# Patient Record
Sex: Male | Born: 1985 | Race: Black or African American | Hispanic: No | Marital: Single | Smoking: Never smoker
Health system: Southern US, Community
[De-identification: ages and names within clinical notes are randomized; demographics above are authoritative.]

---

## 2007-03-31 ENCOUNTER — Emergency Department (HOSPITAL_COMMUNITY): Admission: EM | Admit: 2007-03-31 | Discharge: 2007-03-31 | Payer: Self-pay | Admitting: Emergency Medicine

## 2009-02-09 ENCOUNTER — Emergency Department (HOSPITAL_COMMUNITY): Admission: EM | Admit: 2009-02-09 | Discharge: 2009-02-09 | Payer: Self-pay | Admitting: Emergency Medicine

## 2011-04-15 ENCOUNTER — Encounter (HOSPITAL_COMMUNITY): Payer: Self-pay

## 2011-04-15 ENCOUNTER — Emergency Department (INDEPENDENT_AMBULATORY_CARE_PROVIDER_SITE_OTHER)
Admission: EM | Admit: 2011-04-15 | Discharge: 2011-04-15 | Disposition: A | Discharge status: Home / Self Care | Payer: PRIVATE HEALTH INSURANCE | Source: Home / Self Care | Attending: Emergency Medicine | Admitting: Emergency Medicine

## 2011-04-15 DIAGNOSIS — Z2089 Contact with and (suspected) exposure to other communicable diseases: Secondary | ICD-10-CM

## 2011-04-15 DIAGNOSIS — Z202 Contact with and (suspected) exposure to infections with a predominantly sexual mode of transmission: Secondary | ICD-10-CM

## 2011-04-15 MED ORDER — METRONIDAZOLE 500 MG PO TABS: ORAL_TABLET | ORAL | Status: AC

## 2011-04-15 NOTE — ED Notes (Signed)
Notified by sexual partner that she tested positive to trichomonas.

## 2011-04-15 NOTE — Discharge Instructions (Signed)
You have been diagnosed with a possible STD.  Your results should be back in 3 days.  You can call us here at [phone] and ask for Suzanne.  She can tell you whether or not your results are back, but you must come here to get your results.  We do this to protect our patients' confidentiality.  You can come Monday through Friday and tell the receptionist that your are just here to get test results.  In the meantime, you should avoid intercourse altogether for 1 week.  After that, you should always use condoms--100% of the time.  This will not only prevent pregnancy, but has been shown to prevent HIV, syphilis, gonorrhea, chlamydia, hepatis C and other STDs.  If your test comes back positive, we are required by law to report it to the Health Department.  We also suggest you inform your partner or partners so they can get tested and treated as well.  

## 2011-04-15 NOTE — ED Provider Notes (Signed)
Chief Complaint  Patient presents with  . Exposure to STD    History of Present Illness:   The patient is a 26 year old male who was informed by his girlfriend that she had trichomonas. Her last sexual contact was a month ago. He's not had intercourse with anyone else since then. He denies any symptoms. He has had no discharge, dysuria, penile pain, lesions of the penis, adenopathy, or pain or swelling of the testes. He denies fever, chills, sweats, skin rash, adenopathy, or joint pain.  Review of Systems:  Other than noted above, the patient denies any of the following symptoms: Systemic:  No fevers chills, aches, weight loss, arthralgias, myalgias, or adenopathy. GI:  No abdominal pain, nausea or vomiting. GU:  No dysuria, penile pain, discharge, itching, dysuria, genital lesions, testicular pain or swelling. Skin:  No rash or itching.  PMFSH:  Past medical history, family history, social history, meds, and allergies were reviewed.  Physical Exam:   Vital signs:  BP 113/60  Pulse 55  Temp(Src) 98.1 F (36.7 C) (Oral)  Resp 16  SpO2 100% Gen:  Alert, oriented, in no distress. Abdomen:  Soft and flat, non-distended, and non-tender.  No organomegaly or mass. Genital:  He is uncircumcised. There was no urethral discharge. No lesions on the penis or the genital area. Testes are normal. No inguinal adenopathy. Skin:  Warm and dry.  No rash.   Other Labs Obtained at Urgent Care Center:  GC and Chlamydia DNA probe were obtained as well as serology for HIV and syphilis.  Results are pending at this time and we will call about any positive results.  Assessment:   Diagnoses that have been ruled out:  None  Diagnoses that are still under consideration:  None  Final diagnoses:  Exposure to STD    Plan:   1.  The following meds were prescribed:   New Prescriptions   METRONIDAZOLE (FLAGYL) 500 MG TABLET    2 tabs twice daily   2.  The patient was instructed in symptomatic care and  handouts were given. 3.  The patient was told to return if becoming worse in any way, if no better in 3 or 4 days, and given some red flag symptoms that would indicate earlier return. 4.  The patient was instructed to inform all sexual contacts, avoid intercourse completely for 2 weeks and then only with a condom.  The patient was told that we would call about all abnormal lab results, and that we would need to report certain kinds of infection to the health department.    Reuben Likes, MD 04/15/11 [phone]

## 2011-04-16 LAB — RPR: RPR Ser Ql: NONREACTIVE

## 2011-04-16 LAB — HIV ANTIBODY (ROUTINE TESTING W REFLEX): HIV: NONREACTIVE

## 2011-04-17 LAB — GC/CHLAMYDIA PROBE AMP, GENITAL
Chlamydia, DNA Probe: NEGATIVE
GC Probe Amp, Genital: NEGATIVE

## 2011-05-15 ENCOUNTER — Telehealth (HOSPITAL_COMMUNITY): Payer: Self-pay | Admitting: *Deleted

## 2011-05-15 NOTE — ED Notes (Signed)
Pt. called for his lab results. GC/Chlamydia neg. HIV and RPR non-reactive. Pt. verified x 2 and given results except HIV.  Pt. told he can come in with a picture ID to view that result. He said I had told him, I would have contacted him before now if anything had been positive. I told him I did say that.  Vassie Moselle 05/15/2011

## 2011-09-10 ENCOUNTER — Encounter (HOSPITAL_COMMUNITY): Payer: Self-pay | Admitting: Emergency Medicine

## 2011-09-10 ENCOUNTER — Emergency Department (HOSPITAL_COMMUNITY)
Admission: EM | Admit: 2011-09-10 | Discharge: 2011-09-10 | Disposition: A | Discharge status: Home / Self Care | Payer: Managed Care, Other (non HMO) | Source: Home / Self Care | Attending: Emergency Medicine | Admitting: Emergency Medicine

## 2011-09-10 ENCOUNTER — Emergency Department (HOSPITAL_COMMUNITY)
Admission: EM | Admit: 2011-09-10 | Discharge: 2011-09-10 | Disposition: A | Discharge status: Home / Self Care | Payer: Self-pay | Source: Home / Self Care

## 2011-09-10 DIAGNOSIS — N481 Balanitis: Secondary | ICD-10-CM

## 2011-09-10 DIAGNOSIS — N476 Balanoposthitis: Secondary | ICD-10-CM

## 2011-09-10 DIAGNOSIS — B3749 Other urogenital candidiasis: Secondary | ICD-10-CM

## 2011-09-10 MED ORDER — NYSTATIN 100000 UNIT/GM EX CREA: TOPICAL_CREAM | Freq: Two times a day (BID) | CUTANEOUS | Status: AC

## 2011-09-10 NOTE — ED Notes (Signed)
Rash penis onset x few days

## 2011-09-10 NOTE — ED Provider Notes (Signed)
Chief Complaint  Patient presents with  . Rash    History of Present Illness:   The patient is a 26 year old male who comes in today for STD testing. His girlfriend was diagnosed as having a yeast infection. He noticed some red bumps on the glans penis this week there were somewhat itchy. He is uncircumcised. He denies any blisters or ulcerations. He has had a slight discharge but no dysuria. No fever, chills, adenopathy, or skin rash. He does have a history of Trichomonas in the past.  Review of Systems:  Other than noted above, the patient denies any of the following symptoms: Systemic:  No fevers chills, aches, weight loss, arthralgias, myalgias, or adenopathy. GI:  No abdominal pain, nausea or vomiting. GU:  No dysuria, penile pain, discharge, itching, dysuria, genital lesions, testicular pain or swelling. Skin:  No rash or itching.  PMFSH:  Past medical history, family history, social history, meds, and allergies were reviewed.  Physical Exam:   Vital signs:  BP 132/77  Pulse 61  Temp 98.3 F (36.8 C) (Oral)  Resp 18  SpO2 97% Gen:  Alert, oriented, in no distress. Abdomen:  Soft and flat, non-distended, and non-tender.  No organomegaly or mass. Genital:  He is uncircumcised. The penis itself is normal with no ulcerations or lesions. No inguinal lymphadenopathy and testes were normal. Retracting the foreskin reveals the glands to be normal. There is no inflammation or swelling. No urethral discharge. Skin:  Warm and dry.  No rash.   Other Labs Obtained at Urgent Care Center:  Urethral swab was obtained for GC and Chlamydia DNA probe. Also obtained blood work for HIV, syphilis, and herpes.  Results are pending at this time and we will call about any positive results.   Assessment:  The encounter diagnosis was Balanitis.  I think this is just a Candida balanitis. I don't see any sign of any STDs. His foreskin is fairly tight. He was encouraged to retracting the foreskin released  twice a day and cleanse the foreskin and glans. A call back at any of the tests come back positive.  Plan:   1.  The following meds were prescribed:   New Prescriptions   NYSTATIN CREAM (MYCOSTATIN)    Apply topically 2 (two) times daily.   2.  The patient was instructed in symptomatic care and handouts were given. 3.  The patient was told to return if becoming worse in any way, if no better in 3 or 4 days, and given some red flag symptoms that would indicate earlier return. 4.  The patient was instructed to inform all sexual contacts, avoid intercourse completely for 2 weeks and then only with a condom.  The patient was told that we would call about all abnormal lab results, and that we would need to report certain kinds of infection to the health department.    Reuben Likes, MD 09/10/11 [phone]

## 2011-09-11 LAB — HSV 2 ANTIBODY, IGG: HSV 2 Glycoprotein G Ab, IgG: 0.72 IV

## 2012-04-16 ENCOUNTER — Encounter (HOSPITAL_COMMUNITY): Payer: Self-pay | Admitting: Emergency Medicine

## 2012-04-16 ENCOUNTER — Emergency Department (INDEPENDENT_AMBULATORY_CARE_PROVIDER_SITE_OTHER): Payer: Managed Care, Other (non HMO)

## 2012-04-16 ENCOUNTER — Emergency Department (HOSPITAL_COMMUNITY)
Admission: EM | Admit: 2012-04-16 | Discharge: 2012-04-16 | Disposition: A | Discharge status: Home / Self Care | Payer: Managed Care, Other (non HMO) | Source: Home / Self Care

## 2012-04-16 DIAGNOSIS — S62309A Unspecified fracture of unspecified metacarpal bone, initial encounter for closed fracture: Secondary | ICD-10-CM

## 2012-04-16 MED ORDER — HYDROCODONE-ACETAMINOPHEN 5-325 MG PO TABS: 1.0000 | ORAL_TABLET | Freq: Three times a day (TID) | ORAL | Status: AC | PRN

## 2012-04-16 NOTE — ED Provider Notes (Signed)
History     CSN: [id number]  Arrival date & time 04/16/12  4098   First MD Initiated Contact with Patient 04/16/12 1840      Chief Complaint  Patient presents with  . Hand Injury    injury to right hand after hitting a frig. incident happended about 30 min. ago.      Patient is a 27 y.o. male presenting with hand injury.  Hand Injury - patient is a 27 year old male who today was choking around with friends and punched a refrigerator. Patient had significant pain over the ulnar aspect of his right hand immediately afterwards. Patient had significant amount of swelling as well. Patient denies any history of injury in this hand before. Patient denies any numbness or tingling and states that he is still able to move the hand at this time. Patient is not taking any medications at this time.  History reviewed. No pertinent past medical history.  History reviewed. No pertinent past surgical history.  History reviewed. No pertinent family history.  History  Substance Use Topics  . Smoking status: Never Smoker   . Smokeless tobacco: Not on file  . Alcohol Use: Yes     Comment: occasional      Review of Systems 14 system review was done and unremarkable as related to the chief complaint. Allergies  Review of patient's allergies indicates no known allergies.  Home Medications   Current Outpatient Rx  Name  Route  Sig  Dispense  Refill  . HYDROcodone-acetaminophen (NORCO) 5-325 MG per tablet   Oral   Take 1 tablet by mouth every 8 (eight) hours as needed for pain.   20 tablet   0   . metroNIDAZOLE (FLAGYL) 500 MG tablet      2 tabs twice daily   4 tablet   0   . nystatin cream (MYCOSTATIN)   Topical   Apply topically 2 (two) times daily.   30 g   3     BP 127/63  Pulse 60  Temp(Src) 98.4 F (36.9 C) (Oral)  Resp 16  SpO2 100%  Physical Exam General: No apparent distress alert and oriented x3 mood and affect normal Respiratory: Patient's speak in full  sentences and does not appear short of breath Skin: Warm dry intact with no signs of infection or rash Neuro: Cranial nerves II through XII are intact, neurovascularly intact in all extremities with 2+ DTRs and 2+ pulses. Right hand exam: On inspection patient does have an effusion over the fifth MTP joint. Patient does have good range of motion and is neurovascularly intact in his fingertips. Patient may have approximately 5 of angulation radially of the fifth phalanges. Patient is tender to palpation over the fifth MTP joint. There is no crepitus felt in this area.  X-rays were ordered, reviewed and interpreted by me today. 3 views of the right hand show a fifth metacarpal shaft fracture, transverse but does have angulation of the metacarpal head radially by approximately 15-20. ED Course  Procedures  Labs Reviewed - No data to display Dg Hand Complete Right  04/16/2012  *RADIOLOGY REPORT*  Clinical Data: Right hand pain secondary to blunt trauma.  RIGHT HAND - COMPLETE 3+ VIEW  Comparison: None.  Findings: There is an angulated boxer's fracture of the distal shaft of the fifth metacarpal.  The other bones of the hand are normal.  IMPRESSION: Boxer's fracture of the fifth metacarpal.   Original Report Authenticated By: Francene Boyers, M.D.  1. Hand fracture - metacarpal bone, closed, initial encounter    Angulated boxer fracture.  Referred to ortho for further eval- ? Need for surgery secondary to amount of angulation but patient is functioning well.  norco given  F/u prn Handout given.    MDM          Judi Saa, DO 04/16/12 1958

## 2012-04-16 NOTE — ED Provider Notes (Signed)
Medical screening examination/treatment/procedure(s) were performed by non-physician practitioner and as supervising physician I was immediately available for consultation/collaboration.  Rickesha Veracruz, M.D.  Azuri Bozard C Colena Ketterman, MD 04/16/12 2103 

## 2012-04-16 NOTE — Progress Notes (Signed)
Orthopedic Tech Progress Note Patient Details:  Howard Phillips 1985/09/19 [id number]  Ortho Devices Type of Ortho Device: Ace wrap;Ulna gutter splint Ortho Device/Splint Location: RUE Ortho Device/Splint Interventions: Application;Ordered   [name] 04/16/2012, 8:02 PM

## 2012-04-16 NOTE — ED Notes (Signed)
Reports injury to right hand after hitting a refrigerator 30 min ago.  Swelling of right hand around fourth and fifth finger.   Pain with ROM

## 2012-04-17 ENCOUNTER — Telehealth (HOSPITAL_COMMUNITY): Payer: Self-pay | Admitting: *Deleted

## 2012-04-17 NOTE — ED Notes (Signed)
Pt. called on VM and asked for a different orthopedist than [facility]. States he has a hand fx. I called pt. back and he said his insurance would not cover at [facility] and they were giving him the run around on the phone. He said he has [employer] and it will cover Dr. Merlyn Lot on [facility]. Discussed with Dr. Lorenz Coaster and he said that was fine. He can go where ever he wants to be seen. I told pt. this. He said after he called here, he had called Dr. Merlyn Lot and set up a f/u appt.

## 2012-06-14 ENCOUNTER — Emergency Department (INDEPENDENT_AMBULATORY_CARE_PROVIDER_SITE_OTHER): Payer: Managed Care, Other (non HMO)

## 2012-06-14 ENCOUNTER — Encounter (HOSPITAL_COMMUNITY): Payer: Self-pay | Admitting: Emergency Medicine

## 2012-06-14 ENCOUNTER — Emergency Department (INDEPENDENT_AMBULATORY_CARE_PROVIDER_SITE_OTHER)
Admission: EM | Admit: 2012-06-14 | Discharge: 2012-06-14 | Disposition: A | Discharge status: Home / Self Care | Payer: Managed Care, Other (non HMO) | Source: Home / Self Care | Attending: Emergency Medicine | Admitting: Emergency Medicine

## 2012-06-14 DIAGNOSIS — S62309G Unspecified fracture of unspecified metacarpal bone, subsequent encounter for fracture with delayed healing: Secondary | ICD-10-CM

## 2012-06-14 DIAGNOSIS — S62309A Unspecified fracture of unspecified metacarpal bone, initial encounter for closed fracture: Secondary | ICD-10-CM

## 2012-06-14 DIAGNOSIS — S5290XD Unspecified fracture of unspecified forearm, subsequent encounter for closed fracture with routine healing: Secondary | ICD-10-CM

## 2012-06-14 NOTE — ED Provider Notes (Signed)
Chief Complaint:   Chief Complaint  Patient presents with  . Hand Injury    History of Present Illness:   Howard Phillips is a 27 year old male who sustained a boxer's fracture to his right hand on April 15. He came here, this was splinted, then he was referred to a hand specialist. The splint was removed and he was put in a removable splint. He's been wearing this up until the last week or so and yesterday was playing basketball when another player bumped his hand. He has minor pain over the fracture site. No swelling. He's able to move his little finger well and has had no numbness or tingling.  Review of Systems:  Other than noted above, the patient denies any of the following symptoms: Systemic:  No fevers, chills, sweats, or aches.  No fatigue or tiredness. Musculoskeletal:  No joint pain, arthritis, bursitis, swelling, back pain, or neck pain. Neurological:  No muscular weakness, paresthesias, headache, or trouble with speech or coordination.  No dizziness.  PMFSH:  Past medical history, family history, social history, meds, and allergies were reviewed.    Physical Exam:   Vital signs:  There were no vitals taken for this visit. Gen:  Alert and oriented times 3.  In no distress. Musculoskeletal: There is some deformity of the fifth metacarpal consistent with a boxer's fracture. There was no swelling or bruising. He has minimal tenderness to palpation over the fracture site. The middle finger has full range of motion both flexion and extension with minimal pain.  Otherwise, all joints had a full a ROM with no swelling, bruising or deformity.  No edema, pulses full. Extremities were warm and pink.  Capillary refill was brisk.  Skin:  Clear, warm and dry.  No rash. Neuro:  Alert and oriented times 3.  Muscle strength was normal.  Sensation was intact to light touch.   Radiology:  Dg Hand Complete Right  06/14/2012   *RADIOLOGY REPORT*  Clinical Data: Right hand injury and.  Recent metacarpal  fracture.  RIGHT HAND - COMPLETE 3+ VIEW  Comparison: 04/16/2012  Findings: An incompletely healed fracture of the distal fifth metacarpal is seen, with increased healing since prior study. There is persistent radial and volar angulation of the distal fracture fragment, however this is unchanged.  No evidence of acute fracture or dislocation.  IMPRESSION:  1.  No acute findings. 2.  Incompletely healed distal fifth metacarpal fracture.   Original Report Authenticated By: Myles Rosenthal, M.D.   I reviewed the images independently and personally and concur with the radiologist's findings.  Assessment:  The encounter diagnosis was Boxer's fracture, with delayed healing, subsequent encounter.  The fracture is incompletely healed. I do not think he refractured his. He may have just irritated the fracture site. I recommend that he not play basketball, and wear his removable splint. I offered to re\re splint it again with an ulnar gutter splint, but he declined. I suggest he followup with a hand surgeon, but I do not think he is likely to do this.  Plan:   1.  The following meds were prescribed:   Discharge Medication List as of 06/14/2012  7:32 PM     2.  The patient was instructed in symptomatic care, including rest and activity, elevation, application of ice and compression.  Appropriate handouts were given. 3.  The patient was told to return if becoming worse in any way, if no better in 3 or 4 days, and given some red flag symptoms such  as worsening pain or neurological symptoms that would indicate earlier return.   4.  The patient was told to follow up here as needed.    Reuben Likes, MD 06/14/12 2114

## 2012-06-14 NOTE — ED Notes (Signed)
Pt c/o right hand inj... Reports he was seen here on 4/14 for metacarpal fracture... Had cast taken off about 4-5 days ago by hand specialist Reports he was playing basketball Thursday when he hit his hand against opponent... Would like an xray just to make sure he did not re-injured hand.  He is alert and oriented w/no signs of acute distress.

## 2014-10-15 IMAGING — CR DG HAND COMPLETE 3+V*R*
3 series · 3 of 3 positions shown · non-contrast
Comparison: 04/16/2012

CLINICAL DATA: Right hand injury and.  Recent metacarpal fracture.

RIGHT HAND - COMPLETE 3+ VIEW

[view not recorded (1 of 3)]
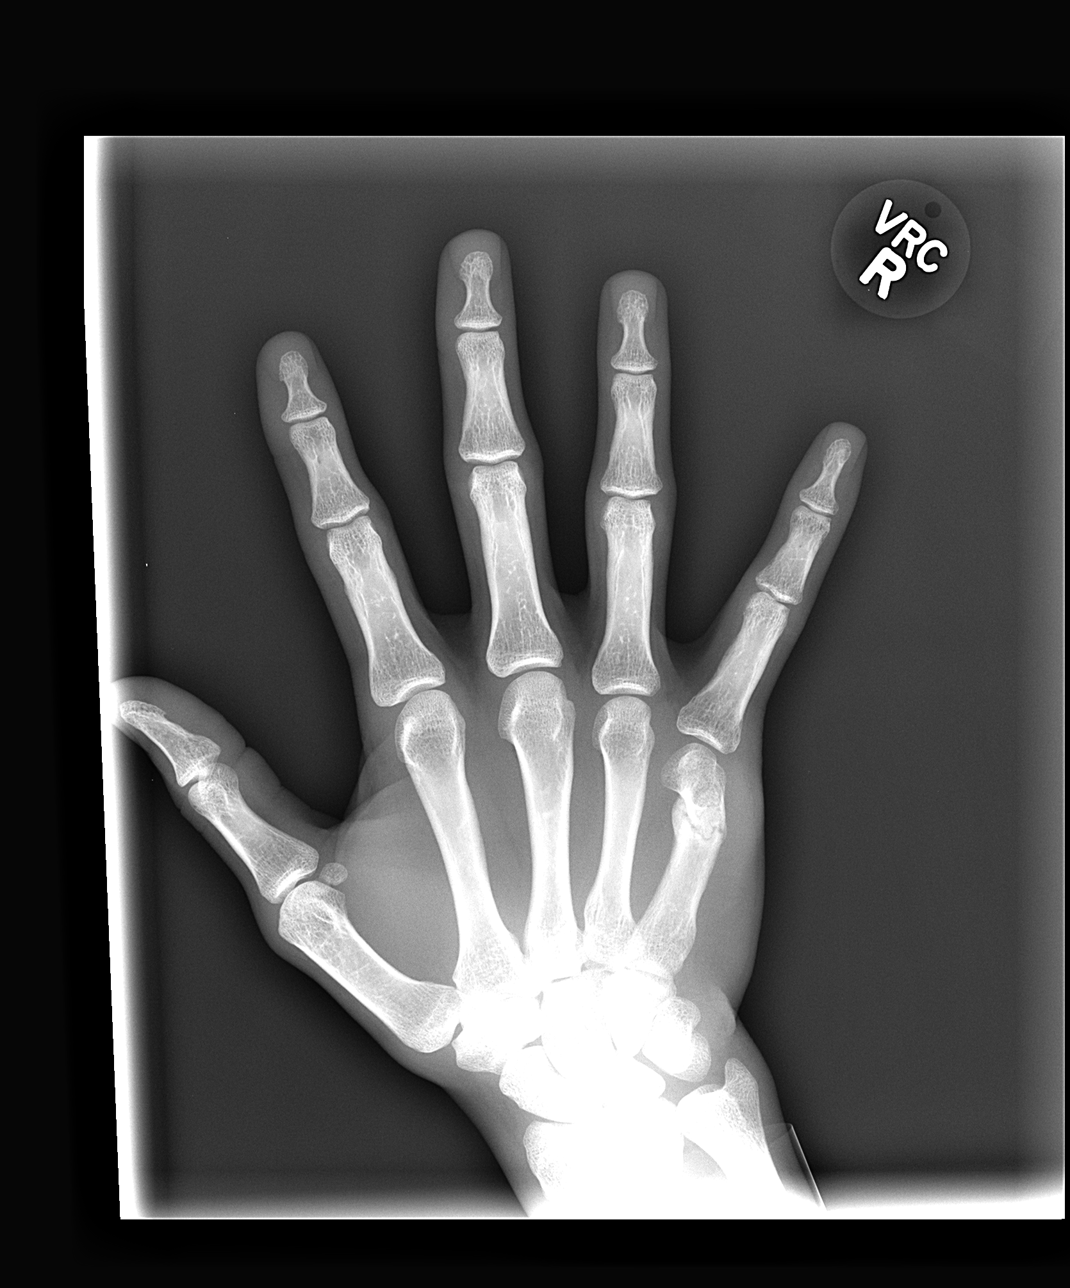

[view not recorded (2 of 3)]
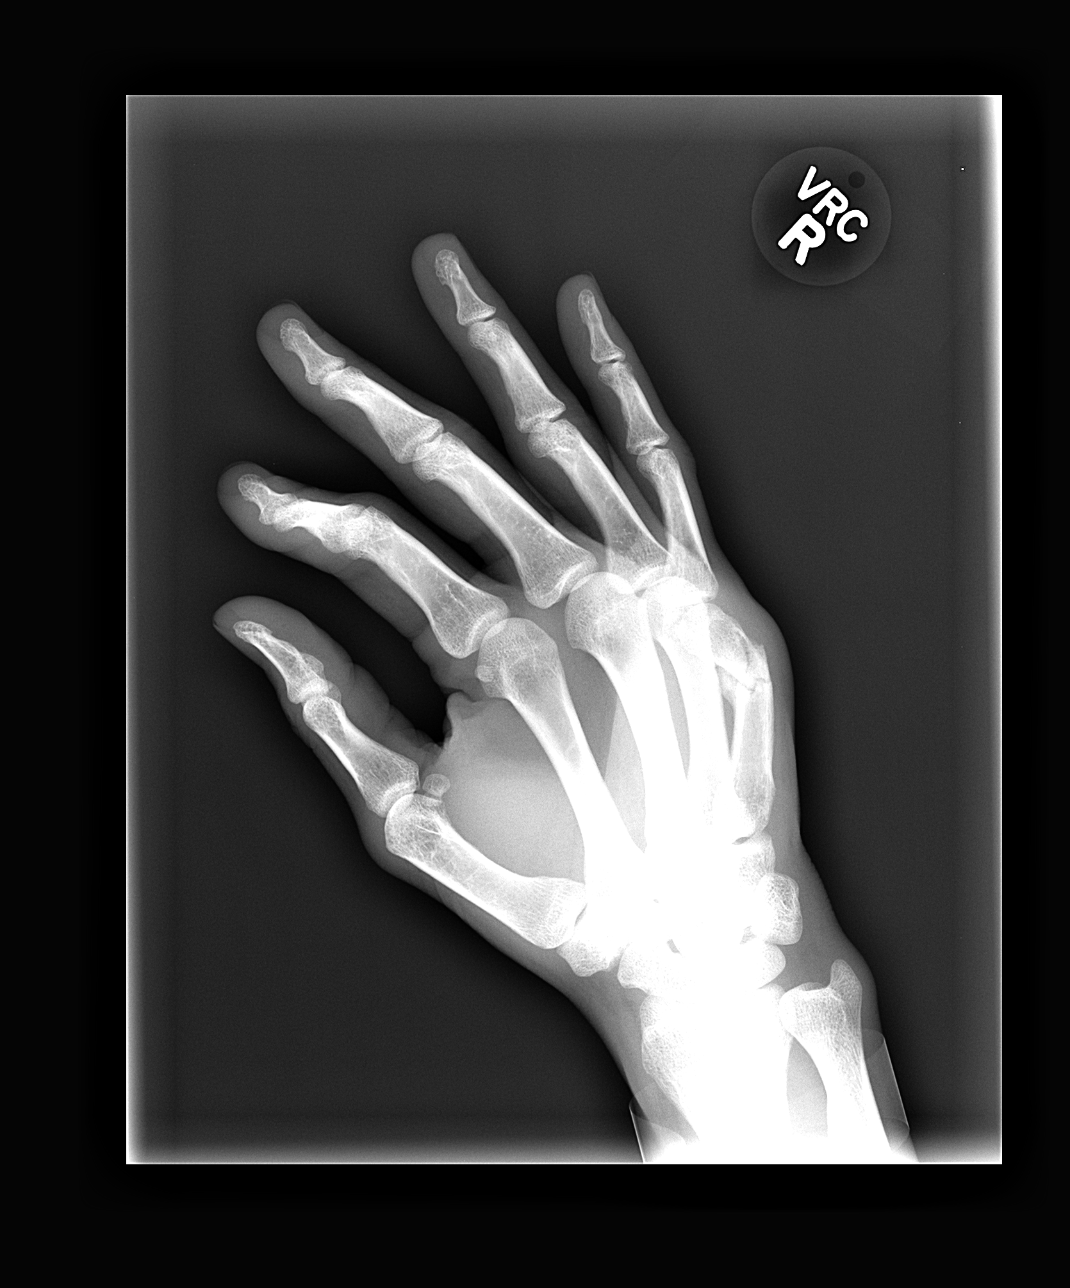

[view not recorded (3 of 3)]
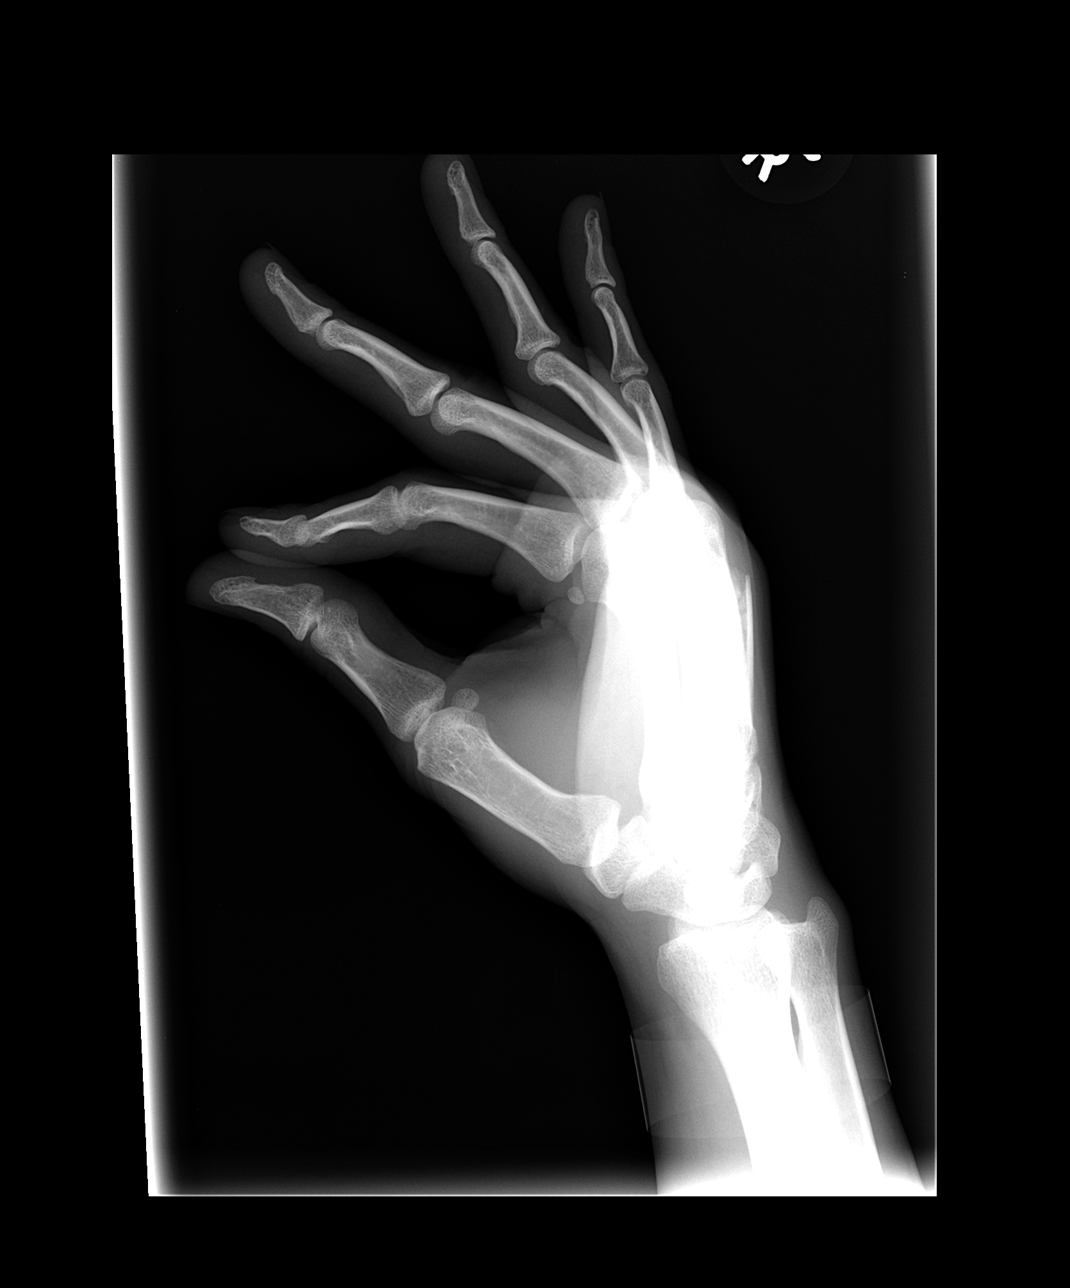

[3 of 3 positions shown; findings below may reference images not displayed]

FINDINGS: An incompletely healed fracture of the distal fifth
metacarpal is seen, with increased healing since prior study.
There is persistent radial and volar angulation of the distal
fracture fragment, however this is unchanged.  No evidence of acute
fracture or dislocation.
IMPRESSION: 1.  No acute findings.
2.  Incompletely healed distal fifth metacarpal fracture.

## 2016-10-16 ENCOUNTER — Encounter (HOSPITAL_COMMUNITY): Payer: Self-pay | Admitting: *Deleted

## 2016-10-16 ENCOUNTER — Ambulatory Visit (HOSPITAL_COMMUNITY)
Admission: EM | Admit: 2016-10-16 | Discharge: 2016-10-16 | Disposition: A | Discharge status: Home / Self Care | Payer: Managed Care, Other (non HMO) | Attending: Emergency Medicine | Admitting: Emergency Medicine

## 2016-10-16 DIAGNOSIS — Z711 Person with feared health complaint in whom no diagnosis is made: Secondary | ICD-10-CM

## 2016-10-16 DIAGNOSIS — Z113 Encounter for screening for infections with a predominantly sexual mode of transmission: Secondary | ICD-10-CM

## 2016-10-16 NOTE — ED Provider Notes (Signed)
[facility]    CSN: [id number] Arrival date & time: 10/16/16  1347     History   Chief Complaint Chief Complaint  Patient presents with  . SEXUALLY TRANSMITTED DISEASE    HPI Howard Phillips is a 31 y.o. male.   31 year old male comes in for STD testing. He is sexually active with one partner, intermittent condom use. States that his partner commented that she is "sore" so he would like to be tested. Denies any symptoms. Specifically denies urinary symptoms such as frequency, dysuria, hematuria. Denies penile lesion, penile discharge, penile pain, testicular swelling/pain. Denies abdominal pain, nausea, vomiting.        History reviewed. No pertinent past medical history.  There are no active problems to display for this patient.   History reviewed. No pertinent surgical history.     Home Medications    Prior to Admission medications   Medication Sig Start Date End Date Taking? Authorizing Provider  HYDROcodone-acetaminophen (NORCO) 5-325 MG per tablet Take 1 tablet by mouth every 8 (eight) hours as needed for pain. 04/16/12   Judi Saa, DO  metroNIDAZOLE (FLAGYL) 500 MG tablet 2 tabs twice daily 04/15/11   Reuben Likes, MD    Family History History reviewed. No pertinent family history.  Social History Social History  Substance Use Topics  . Smoking status: Never Smoker  . Smokeless tobacco: Never Used  . Alcohol use Yes     Comment: occasional     Allergies   Patient has no known allergies.   Review of Systems Review of Systems  Reason unable to perform ROS: See HPI as above.     Physical Exam Triage Vital Signs ED Triage Vitals [10/16/16 1445]  Enc Vitals Group     BP 117/68     Pulse Rate 65     Resp 16     Temp 98.1 F (36.7 C)     Temp Source Oral     SpO2 100 %     Weight      Height      Head Circumference      Peak Flow      Pain Score      Pain Loc      Pain Edu?      Excl. in GC?    No data  found.   Updated Vital Signs BP 117/68 (BP Location: Left Arm)   Pulse 65   Temp 98.1 F (36.7 C) (Oral)   Resp 16   SpO2 100%   Physical Exam  Constitutional: He is oriented to person, place, and time. He appears well-developed and well-nourished. No distress.  HENT:  Head: Normocephalic and atraumatic.  Eyes: Pupils are equal, round, and reactive to light. Conjunctivae are normal.  Genitourinary:  Genitourinary Comments: Deferred. Patient urinated for cytology.   Neurological: He is alert and oriented to person, place, and time.     UC Treatments / Results  Labs (all labs ordered are listed, but only abnormal results are displayed) Labs Reviewed  URINE CYTOLOGY ANCILLARY ONLY    EKG  EKG Interpretation None       Radiology No results found.  Procedures Procedures (including critical care time)  Medications Ordered in UC Medications - No data to display   Initial Impression / Assessment and Plan / UC Course  I have reviewed the triage vital signs and the nursing notes.  Pertinent labs & imaging results that were available during my care of the  patient were reviewed by me and considered in my medical decision making (see chart for details).    Offered empiric treatment of GC, patient declined. Cytology sent, patient will be contacted with any positive results that require additional treatment. Patient to refrain from sexual activity for the next 7 days. Return precautions given.   Final Clinical Impressions(s) / UC Diagnoses   Final diagnoses:  Concern about STD in male without diagnosis    New Prescriptions New Prescriptions   No medications on file      Lurline Idol 10/16/16 2440

## 2016-10-16 NOTE — Discharge Instructions (Signed)
We are testing you for gonorrhea, chlamydia and trichomonas. Cytology sent, you will be contacted with any positive results that requires further treatment. Refrain from sexual activity for the next 7 days. Monitor for any worsening of symptoms, fever, abdominal pain, nausea, vomiting, to follow up for reevaluation.

## 2016-10-16 NOTE — ED Triage Notes (Signed)
Patient states he would like to be tested for STDs. States intermittent condom use, states has one partner. Denies any symptoms.

## 2017-07-07 ENCOUNTER — Other Ambulatory Visit: Payer: Self-pay

## 2017-07-07 ENCOUNTER — Emergency Department (INDEPENDENT_AMBULATORY_CARE_PROVIDER_SITE_OTHER)
Admission: EM | Admit: 2017-07-07 | Discharge: 2017-07-07 | Disposition: A | Discharge status: Home / Self Care | Payer: Managed Care, Other (non HMO) | Source: Home / Self Care | Attending: Family Medicine | Admitting: Family Medicine

## 2017-07-07 DIAGNOSIS — N471 Phimosis: Secondary | ICD-10-CM | POA: Diagnosis not present

## 2017-07-07 MED ORDER — HYDROCORTISONE 2.5 % EX OINT: TOPICAL_OINTMENT | CUTANEOUS | 0 refills | Status: AC

## 2017-07-07 NOTE — ED Provider Notes (Signed)
[facility]    CSN: [id number] Arrival date & time: 07/07/17  1531     History   Chief Complaint No chief complaint on file.   HPI Howard Phillips is a 32 y.o. male.   Patient reports that he has tight foreskin and mild difficulty retracting the foreskin.  He denies pain or swelling of the glans or foreskin.  No rash, discharge, or dysuria.  He is concerned that he may develop phimosis or paraphimosis.  The history is provided by the patient.    History reviewed. No pertinent past medical history.  There are no active problems to display for this patient.   History reviewed. No pertinent surgical history.     Home Medications    Prior to Admission medications   Medication Sig Start Date End Date Taking? Authorizing Provider  HYDROcodone-acetaminophen (NORCO) 5-325 MG per tablet Take 1 tablet by mouth every 8 (eight) hours as needed for pain. 04/16/12   Judi SaaSmith, Zachary M, DO  hydrocortisone 2.5 % ointment Apply once daily 07/07/17   Lattie HawBeese, Stephen A, MD  metroNIDAZOLE (FLAGYL) 500 MG tablet 2 tabs twice daily 04/15/11   Reuben LikesKeller, David C, MD    Family History History reviewed. No pertinent family history.  Social History Social History   Tobacco Use  . Smoking status: Never Smoker  . Smokeless tobacco: Never Used  Substance Use Topics  . Alcohol use: Yes    Comment: occasional  . Drug use: No     Allergies   Patient has no known allergies.   Review of Systems Review of Systems  Constitutional: Negative.   Gastrointestinal: Negative for abdominal pain.  Genitourinary: Negative for difficulty urinating, discharge, frequency, genital sores, penile pain, penile swelling, scrotal swelling, testicular pain and urgency.  All other systems reviewed and are negative.    Physical Exam Triage Vital Signs ED Triage Vitals  Enc Vitals Group     BP 07/07/17 1550 123/79     Pulse Rate 07/07/17 1550 (!) 52     Resp --      Temp 07/07/17 1550 97.7 F (36.5  C)     Temp Source 07/07/17 1550 Oral     SpO2 07/07/17 1550 99 %     Weight 07/07/17 1551 162 lb (73.5 kg)     Height --      Head Circumference --      Peak Flow --      Pain Score 07/07/17 1550 0     Pain Loc --      Pain Edu? --      Excl. in GC? --    No data found.  Updated Vital Signs BP 123/79 (BP Location: Right Arm)   Pulse (!) 52   Temp 97.7 F (36.5 C) (Oral)   Wt 162 lb (73.5 kg)   SpO2 99%   Visual Acuity Right Eye Distance:   Left Eye Distance:   Bilateral Distance:    Right Eye Near:   Left Eye Near:    Bilateral Near:     Physical Exam  Constitutional: He appears well-developed and well-nourished.  HENT:  Head: Normocephalic.  Mouth/Throat: Oropharynx is clear and moist.  Eyes: Pupils are equal, round, and reactive to light.  Cardiovascular: Normal heart sounds.  Pulmonary/Chest: Breath sounds normal.  Abdominal: There is no tenderness.  Genitourinary: Testes normal and penis normal. Uncircumcised. No phimosis, paraphimosis, hypospadias, penile erythema or penile tenderness. No discharge found.  Genitourinary Comments: Foreskin appears somewhat constrictive, but patient  is able to retract foreskin without difficulty.  Glans reveals no erythema, exudate, swelling, tenderness, or ulceration.  No evidence phimosis or paraphimosis.  Musculoskeletal: He exhibits no edema.  Lymphadenopathy:    He has no cervical adenopathy.  Neurological: He is alert.  Skin: Skin is warm and dry.  Nursing note and vitals reviewed.    UC Treatments / Results  Labs (all labs ordered are listed, but only abnormal results are displayed) Labs Reviewed - No data to display  EKG None  Radiology No results found.  Procedures Procedures (including critical care time)  Medications Ordered in UC Medications - No data to display  Initial Impression / Assessment and Plan / UC Course  I have reviewed the triage vital signs and the nursing notes.  Pertinent labs &  imaging results that were available during my care of the patient were reviewed by me and considered in my medical decision making (see chart for details).    Reassurance. Trial of hydrocortisone ointment for 7 to 10 days. Recommend follow-up with urologist if symptoms do not improve or worsen.   Final Clinical Impressions(s) / UC Diagnoses   Final diagnoses:  Tight foreskin     Discharge Instructions     Retract foreskin twice daily and clean gently with saline solution. Apply either non-prescription 1% hydrocortisone ointment twice daily, or prescription 2.5% ointment once daily for about 7 to 10 days.    ED Prescriptions    Medication Sig Dispense Auth. Provider   hydrocortisone 2.5 % ointment Apply once daily 20 g Lattie Haw, MD        Lattie Haw, MD 07/14/17 [phone]

## 2017-07-07 NOTE — Discharge Instructions (Addendum)
Retract foreskin twice daily and clean gently with saline solution. Apply either non-prescription 1% hydrocortisone ointment twice daily, or prescription 2.5% ointment once daily for about 7 to 10 days.

## 2017-07-07 NOTE — ED Triage Notes (Signed)
Pt wants to discuss CC to doctor only.
